# Patient Record
Sex: Male | Born: 1985 | Race: White | Hispanic: No | Marital: Married | Smoking: Never smoker
Health system: Southern US, Community
[De-identification: ages and names within clinical notes are randomized; demographics above are authoritative.]

## PROBLEM LIST (undated history)

## (undated) DIAGNOSIS — E669 Obesity, unspecified: Secondary | ICD-10-CM

## (undated) DIAGNOSIS — R079 Chest pain, unspecified: Secondary | ICD-10-CM

## (undated) DIAGNOSIS — F419 Anxiety disorder, unspecified: Secondary | ICD-10-CM

## (undated) DIAGNOSIS — M722 Plantar fascial fibromatosis: Secondary | ICD-10-CM

## (undated) HISTORY — DX: Anxiety disorder, unspecified: F41.9

## (undated) HISTORY — DX: Obesity, unspecified: E66.9

## (undated) HISTORY — DX: Chest pain, unspecified: R07.9

## (undated) HISTORY — DX: Plantar fascial fibromatosis: M72.2

---
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
## 2007-11-17 ENCOUNTER — Observation Stay (HOSPITAL_COMMUNITY): Admission: EM | Admit: 2007-11-17 | Discharge: 2007-11-19 | Payer: Self-pay | Admitting: Internal Medicine

## 2009-04-08 HISTORY — PX: WISDOM TOOTH EXTRACTION: SHX21
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
## 2010-11-08 ENCOUNTER — Encounter: Payer: Self-pay | Admitting: Podiatry

## 2011-01-04 LAB — HEPATIC FUNCTION PANEL
ALT: 22
AST: 24
Albumin: 3.6
Alkaline Phosphatase: 39
Bilirubin, Direct: <0.1
Total Bilirubin: 0.5
Total Protein: 6.2

## 2011-01-04 LAB — URINALYSIS, ROUTINE W REFLEX MICROSCOPIC
Bilirubin Urine: NEGATIVE
Glucose, UA: NEGATIVE
Hgb urine dipstick: NEGATIVE
Ketones, ur: NEGATIVE
Nitrite: NEGATIVE
Protein, ur: NEGATIVE
Specific Gravity, Urine: 1.034 — ABNORMAL HIGH
Urobilinogen, UA: 0.2
pH: 6

## 2011-01-04 LAB — CBC
HCT: 45.6
Hemoglobin: 15.8
MCHC: 34.6
MCV: 89.7
Platelets: 282
RBC: 5.08
RDW: 12.1
WBC: 4.6

## 2011-01-04 LAB — BASIC METABOLIC PANEL
BUN: 15
CO2: 22
Calcium: 8.8
Chloride: 107
Creatinine, Ser: 0.97
GFR calc Af Amer: >60
GFR calc non Af Amer: >60
Glucose, Bld: 140 — ABNORMAL HIGH
Potassium: 4
Sodium: 138

## 2017-02-06 DIAGNOSIS — F321 Major depressive disorder, single episode, moderate: Secondary | ICD-10-CM

## 2017-02-06 HISTORY — DX: Major depressive disorder, single episode, moderate: F32.1

## 2017-12-17 DIAGNOSIS — Z6833 Body mass index (BMI) 33.0-33.9, adult: Secondary | ICD-10-CM | POA: Diagnosis not present

## 2017-12-17 DIAGNOSIS — F418 Other specified anxiety disorders: Secondary | ICD-10-CM | POA: Diagnosis not present

## 2018-01-01 DIAGNOSIS — Z23 Encounter for immunization: Secondary | ICD-10-CM | POA: Diagnosis not present

## 2018-01-20 DIAGNOSIS — Z31441 Encounter for testing of male partner of patient with recurrent pregnancy loss: Secondary | ICD-10-CM | POA: Diagnosis not present

## 2018-03-10 DIAGNOSIS — J111 Influenza due to unidentified influenza virus with other respiratory manifestations: Secondary | ICD-10-CM | POA: Diagnosis not present

## 2018-06-08 DIAGNOSIS — Z1339 Encounter for screening examination for other mental health and behavioral disorders: Secondary | ICD-10-CM | POA: Diagnosis not present

## 2018-06-08 DIAGNOSIS — F321 Major depressive disorder, single episode, moderate: Secondary | ICD-10-CM | POA: Diagnosis not present

## 2018-06-08 DIAGNOSIS — F418 Other specified anxiety disorders: Secondary | ICD-10-CM | POA: Diagnosis not present

## 2018-06-08 DIAGNOSIS — E668 Other obesity: Secondary | ICD-10-CM | POA: Diagnosis not present

## 2018-06-08 DIAGNOSIS — Z1331 Encounter for screening for depression: Secondary | ICD-10-CM | POA: Diagnosis not present

## 2018-10-08 DIAGNOSIS — D225 Melanocytic nevi of trunk: Secondary | ICD-10-CM | POA: Diagnosis not present

## 2018-10-08 DIAGNOSIS — D1801 Hemangioma of skin and subcutaneous tissue: Secondary | ICD-10-CM | POA: Diagnosis not present

## 2018-10-08 DIAGNOSIS — D485 Neoplasm of uncertain behavior of skin: Secondary | ICD-10-CM | POA: Diagnosis not present

## 2018-10-08 DIAGNOSIS — L570 Actinic keratosis: Secondary | ICD-10-CM | POA: Diagnosis not present

## 2018-10-08 DIAGNOSIS — L812 Freckles: Secondary | ICD-10-CM | POA: Diagnosis not present

## 2018-12-25 DIAGNOSIS — Z20828 Contact with and (suspected) exposure to other viral communicable diseases: Secondary | ICD-10-CM | POA: Diagnosis not present

## 2019-06-21 DIAGNOSIS — E669 Obesity, unspecified: Secondary | ICD-10-CM | POA: Diagnosis not present

## 2019-06-21 DIAGNOSIS — Z1331 Encounter for screening for depression: Secondary | ICD-10-CM | POA: Diagnosis not present

## 2019-06-21 DIAGNOSIS — F419 Anxiety disorder, unspecified: Secondary | ICD-10-CM | POA: Diagnosis not present

## 2019-06-21 DIAGNOSIS — F321 Major depressive disorder, single episode, moderate: Secondary | ICD-10-CM | POA: Diagnosis not present

## 2019-08-15 ENCOUNTER — Encounter (HOSPITAL_BASED_OUTPATIENT_CLINIC_OR_DEPARTMENT_OTHER): Payer: Self-pay

## 2019-08-15 ENCOUNTER — Other Ambulatory Visit: Payer: Self-pay

## 2019-08-15 ENCOUNTER — Emergency Department (HOSPITAL_BASED_OUTPATIENT_CLINIC_OR_DEPARTMENT_OTHER)
Admission: EM | Admit: 2019-08-15 | Discharge: 2019-08-15 | Disposition: A | Discharge status: Home / Self Care | Payer: BC Managed Care – PPO | Attending: Emergency Medicine | Admitting: Emergency Medicine

## 2019-08-15 DIAGNOSIS — W57XXXA Bitten or stung by nonvenomous insect and other nonvenomous arthropods, initial encounter: Secondary | ICD-10-CM | POA: Insufficient documentation

## 2019-08-15 DIAGNOSIS — Y999 Unspecified external cause status: Secondary | ICD-10-CM | POA: Diagnosis not present

## 2019-08-15 DIAGNOSIS — S20461A Insect bite (nonvenomous) of right back wall of thorax, initial encounter: Secondary | ICD-10-CM | POA: Insufficient documentation

## 2019-08-15 DIAGNOSIS — Y92007 Garden or yard of unspecified non-institutional (private) residence as the place of occurrence of the external cause: Secondary | ICD-10-CM | POA: Diagnosis not present

## 2019-08-15 DIAGNOSIS — Y9389 Activity, other specified: Secondary | ICD-10-CM | POA: Insufficient documentation

## 2019-08-15 DIAGNOSIS — S1095XA Superficial foreign body of unspecified part of neck, initial encounter: Secondary | ICD-10-CM | POA: Diagnosis not present

## 2019-08-15 DIAGNOSIS — S1096XA Insect bite of unspecified part of neck, initial encounter: Secondary | ICD-10-CM | POA: Diagnosis not present

## 2019-08-15 MED ORDER — DOXYCYCLINE HYCLATE 100 MG PO TABS
200.0000 mg | ORAL_TABLET | Freq: Once | ORAL | Status: AC
  Administered 2019-08-15: 200 mg via ORAL
  Filled 2019-08-15: qty 2

## 2019-08-15 NOTE — ED Triage Notes (Signed)
Pt noticed a tick on his upper R back today. Small area of surrounding erythema present. Denies any ill feelings.

## 2019-08-15 NOTE — Discharge Instructions (Signed)
Follow up with primary care provider if you notice increased redness or warmth.

## 2019-08-15 NOTE — ED Provider Notes (Signed)
[place] EMERGENCY DEPARTMENT Provider Note   CSN: [id number] Arrival date & time: 08/15/19  1555    History Chief Complaint  Patient presents with  . Tick Removal    Bryan Swanson is a 34 y.o. male with no significant past medical history who presents for evaluation of tick to back.  Did yard work proximately 48 hours ago.  Wife noticed today patient with approximately a millimeter brown tick to right scapula.  Has not tried to remove this.  There is approximately 1.2 cm area of surrounding erythema.  No fluctuance or induration.  Patient denies fever, chills, nausea, vomiting, target lesions, bulla, sensation of throat closing, rashes, lesions.  In addition aggravating relieving factors.  Denies any current pain.  Patient obtained from patient and past medical record.  No interpreter is used.  HPI     History reviewed. No pertinent past medical history.  There are no problems to display for this patient.   History reviewed. No pertinent surgical history.     No family history on file.  Social History   Tobacco Use  . Smoking status: Never Smoker  . Smokeless tobacco: Never Used  Substance Use Topics  . Alcohol use: Yes  . Drug use: Never    Home Medications Prior to Admission medications   Not on File    Allergies    Penicillins  Review of Systems   Review of Systems  Constitutional: Negative.   HENT: Negative.   Respiratory: Negative.   Cardiovascular: Negative.   Gastrointestinal: Negative.   Genitourinary: Negative.   Musculoskeletal: Negative.   Skin: Positive for wound.  Neurological: Negative.   All other systems reviewed and are negative.   Physical Exam Updated Vital Signs BP 136/89   Pulse 67   Temp 98.6 F (37 C) (Oral)   Resp 20   Ht 5\' 9"  (1.753 m)   Wt 99.8 kg   SpO2 95%   BMI 32.49 kg/m   Physical Exam Vitals and nursing note reviewed.  Constitutional:      General: He is not in acute distress.  Appearance: He is well-developed. He is not ill-appearing, toxic-appearing or diaphoretic.  HENT:     Head: Normocephalic and atraumatic.     Nose: Nose normal.     Mouth/Throat:     Mouth: Mucous membranes are moist.  Eyes:     Pupils: Pupils are equal, round, and reactive to light.  Cardiovascular:     Rate and Rhythm: Normal rate and regular rhythm.     Pulses: Normal pulses.     Heart sounds: Normal heart sounds.  Pulmonary:     Effort: Pulmonary effort is normal. No respiratory distress.     Breath sounds: Normal breath sounds.  Abdominal:     General: Bowel sounds are normal. There is no distension.     Palpations: Abdomen is soft.  Musculoskeletal:        General: No swelling, tenderness, deformity or signs of injury. Normal range of motion.     Cervical back: Normal range of motion and neck supple.     Right lower leg: No edema.     Left lower leg: No edema.  Skin:    General: Skin is warm and dry.     Capillary Refill: Capillary refill takes less than 2 seconds.     Comments: 3 millimeter brown tick to right scapula.  Quarter size erythema surrounding this.  No active bleeding or drainage.  No fluctuance or induration.  No target lesions, bulla.  Neurological:     General: No focal deficit present.     Mental Status: He is alert and oriented to person, place, and time.     ED Results / Procedures / Treatments   Labs (all labs ordered are listed, but only abnormal results are displayed) Labs Reviewed - No data to display  EKG None  Radiology No results found.  Procedures .Foreign Body Removal  Date/Time: 08/15/2019 4:23 PM Performed by: Linwood Dibbles, PA-C Authorized by: Linwood Dibbles, PA-C  Consent: Verbal consent obtained. Written consent not obtained. Risks and benefits: risks, benefits and alternatives were discussed Consent given by: patient Patient understanding: patient states understanding of the procedure being performed Patient consent:  the patient's understanding of the procedure matches consent given Procedure consent: procedure consent matches procedure scheduled Relevant documents: relevant documents present and verified Test results: test results available and properly labeled Site marked: the operative site was marked Imaging studies: imaging studies available Required items: required blood products, implants, devices, and special equipment available Body area: skin General location: trunk Location details: back  Anesthesia: Anesthetic total: 0 mL  Sedation: Patient sedated: no  Patient restrained: no Patient cooperative: yes Localization method: visualized Removal mechanism: forceps Dressing: dressing applied Tendon involvement: none Depth: subcutaneous Complexity: simple 1 objects recovered. Objects recovered: tick in entirety with head included Post-procedure assessment: foreign body removed Patient tolerance: patient tolerated the procedure well with no immediate complications   (including critical care time)  Medications Ordered in ED Medications  doxycycline (VIBRA-TABS) tablet 200 mg (200 mg Oral Given 08/15/19 1624)    ED Course  I have reviewed the triage vital signs and the nursing notes.  Pertinent labs & imaging results that were available during my care of the patient were reviewed by me and considered in my medical decision making (see chart for details).  34 year old male presents for evaluation of tach.  Did yard work outside proximally 48 hours ago.  Afebrile, nonseptic, non-ill-appearing.  No target lesion however does have dime sized erythema surrounding tick.  Small tick approximately 3 mm, brown in nature.  Subsequently removed in entirety including head utilized by myself and nursing staff. no fluctuance or induration or evidence of abscess.  No systemic symptoms.  Will treat with single dose of doxycycline.  Discussed return precautions.  He may follow-up outpatient with  PCP.  The patient has been appropriately medically screened and/or stabilized in the ED. I have low suspicion for any other emergent medical condition which would require further screening, evaluation or treatment in the ED or require inpatient management.  Patient is hemodynamically stable and in no acute distress.  Patient able to ambulate in department prior to ED.  Evaluation does not show acute pathology that would require ongoing or additional emergent interventions while in the emergency department or further inpatient treatment.  I have discussed the diagnosis with the patient and answered all questions.  Pain is been managed while in the emergency department and patient has no further complaints prior to discharge.  Patient is comfortable with plan discussed in room and is stable for discharge at this time.  I have discussed strict return precautions for returning to the emergency department.  Patient was encouraged to follow-up with PCP/specialist refer to at discharge.    MDM Rules/Calculators/A&P                       Final Clinical Impression(s) / ED Diagnoses Final diagnoses:  Tick bite with subsequent removal of tick    Rx / DC Orders ED Discharge Orders    None       Quetzali Heinle A, PA-C 08/15/19 1624    Arby Barrette, MD 08/15/19 1642

## 2019-09-29 DIAGNOSIS — J329 Chronic sinusitis, unspecified: Secondary | ICD-10-CM | POA: Diagnosis not present

## 2019-11-28 DIAGNOSIS — Z20822 Contact with and (suspected) exposure to covid-19: Secondary | ICD-10-CM | POA: Diagnosis not present

## 2020-01-24 DIAGNOSIS — R079 Chest pain, unspecified: Secondary | ICD-10-CM | POA: Diagnosis not present

## 2020-01-24 DIAGNOSIS — E669 Obesity, unspecified: Secondary | ICD-10-CM | POA: Diagnosis not present

## 2020-02-02 ENCOUNTER — Encounter: Payer: Self-pay | Admitting: Cardiovascular Disease

## 2020-02-02 DIAGNOSIS — Z23 Encounter for immunization: Secondary | ICD-10-CM | POA: Diagnosis not present

## 2020-02-08 NOTE — Progress Notes (Signed)
Cardiology Office Note:   Date:  02/09/2020  NAME:  Bryan Swanson    MRN: [id number] DOB:  1985-06-12   PCP:  Haywood Pao, MD  Cardiologist:  No primary care provider on file.   Referring MD: Geoffery Lyons, NP   Chief Complaint  Patient presents with  . Chest Pain   History of Present Illness:   Bryan Swanson is a 34 y.o. male with a hx of anxiety who is being seen today for the evaluation of chest pain at the request of Geoffery Lyons, NP.  He reports for the last 6 to 8 months has had spells of dizziness chest pain and palpitations.  He reports are getting worse over the last few months.  He had coronavirus 2 months ago.  He reports that at times when he exerts himself he can feel his heart racing.  He can get dizzy.  He is never passed out but feels like he is going to.  He also gets dull pain in both sides of his chest.  He reports symptoms last 10 to 15 minutes.  They resolve without intervention.  The main thing that bring these on his exertion.  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  He is taken more of an administrative role and is not doing as much.  He also has a second job as a Development worker, international aid.  Apparently he is not done much of this as of lately.  His blood pressure was 131/88.  Medical history is only significant for depression.  He presents with his wife.  She is pregnant with their third child.  He has 2 twin daughters.  He reports he is not stress any more than recently.  He does not smoke.  He consumes alcohol in moderation.  No illicit drug use is reported.  He reports he does not exercise routinely and has not done so since his symptoms started.  No recent thyroid panel.  Labs from his primary care physician demonstrate normal creatinine of 1.0.  He had a hemoglobin of 15.  He does snore.  He is chronically tired.  We do need updated thyroid panel.  His wife reports he snores but does not stop breathing.  He is okay to go home sleep study.  He reports a strong family  history of heart disease.  His grandfather had a heart attack at 74.  Father had a heart attack at 66.  Overall he is concerned he may be developing heart disease.  His EKG today is normal.  Past Medical History: Past Medical History:  Diagnosis Date  . Anxiety disorder   . Chest pain   . Major depressive disorder, single episode, moderate ([place]) 02/2017  . Obesity   . Plantar fascial fibromatosis    left foot    Past Surgical History: Past Surgical History:  Procedure Laterality Date  . WISDOM TOOTH EXTRACTION  2011    Current Medications: Current Meds  Medication Sig  . escitalopram (LEXAPRO) 10 MG tablet Take 1 tablet by mouth daily.     Allergies:    Penicillins   Social History: Social History   Socioeconomic History  . Marital status: Married    Spouse name: Not on file  . Number of children: 2  . Years of education: Not on file  . Highest education level: Not on file  Occupational History  . Occupation: Research officer, trade union  Tobacco Use  . Smoking status: Never Smoker  . Smokeless tobacco: Never  Used  Vaping Use  . Vaping Use: Never used  Substance and Sexual Activity  . Alcohol use: Yes  . Drug use: Never  . Sexual activity: Not on file  Other Topics Concern  . Not on file  Social History Narrative  . Not on file   Social Determinants of Health   Financial Resource Strain:   . Difficulty of Paying Living Expenses: Not on file  Food Insecurity:   . Worried About Charity fundraiser in the Last Year: Not on file  . Ran Out of Food in the Last Year: Not on file  Transportation Needs:   . Lack of Transportation (Medical): Not on file  . Lack of Transportation (Non-Medical): Not on file  Physical Activity:   . Days of Exercise per Week: Not on file  . Minutes of Exercise per Session: Not on file  Stress:   . Feeling of Stress : Not on file  Social Connections:   . Frequency of Communication with Friends and Family: Not on file  . Frequency of Social  Gatherings with Friends and Family: Not on file  . Attends Religious Services: Not on file  . Active Member of Clubs or Organizations: Not on file  . Attends Archivist Meetings: Not on file  . Marital Status: Not on file     Family History: The patient's family history includes Cancer in his mother; Heart attack (age of onset: 67) in his maternal grandfather; Heart attack (age of onset: 50) in his father.  ROS:   All other ROS reviewed and negative. Pertinent positives noted in the HPI.     EKGs/Labs/Other Studies Reviewed:   The following studies were personally reviewed by me today:  EKG:  EKG is ordered today.  The ekg ordered today demonstrates normal sinus rhythm, heart rate 62, no acute ischemic changes, no evidence of prior infarction, and was personally reviewed by me.   Recent Labs: No results found for requested labs within last 8760 hours.   Recent Lipid Panel No results found for: CHOL, TRIG, HDL, CHOLHDL, VLDL, LDLCALC, LDLDIRECT  Physical Exam:   VS:  BP 131/88   Pulse 62   Ht 5' 9"  (1.753 m)   Wt 231 lb 6.4 oz (105 kg)   SpO2 99%   BMI 34.17 kg/m    Wt Readings from Last 3 Encounters:  02/09/20 231 lb 6.4 oz (105 kg)  08/15/19 220 lb (99.8 kg)    General: Well nourished, well developed, in no acute distress Heart: Atraumatic, normal size  Eyes: PEERLA, EOMI  Neck: Supple, no JVD Endocrine: No thryomegaly Cardiac: Normal S1, S2; RRR; no murmurs, rubs, or gallops Lungs: Clear to auscultation bilaterally, no wheezing, rhonchi or rales  Abd: Soft, nontender, no hepatomegaly  Ext: No edema, pulses 2+ Musculoskeletal: No deformities, BUE and BLE strength normal and equal Skin: Warm and dry, no rashes   Neuro: Alert and oriented to person, place, time, and situation, CNII-XII grossly intact, no focal deficits  Psych: Normal mood and affect   ASSESSMENT:   Bryan Swanson is a 34 y.o. male who presents for the following: 1. Chest pain,  unspecified type   2. Palpitations   3. Chronic fatigue   4. Snoring   5. Precordial pain     PLAN:   1. Chest pain, unspecified type -Intermittent episodes of exertional chest pain.  Described as dull pain.  Can last minutes.  Resolves with rest.  Also associated with dizziness and  palpitations.  He is awfully young for heart disease.  However he has a strong family history.  I do not have the results of his recent cholesterol panel.  Main CVD risk factors include obesity and strong family history.  Given that his symptoms may represent angina I recommended a coronary CTA for further evaluation.  He will give [place] a BMP today.  We will set him up for a coronary CTA.  He will take 50 mg of metoprolol tartrate to our before the scan.  He also will obtain an echocardiogram.  2. Palpitations -Symptoms also occur with palpitations.  They are exertional.  Could be an arrhythmia.  We will proceed with a 3-day Zio patch.  Needs a TSH.  3. Chronic fatigue 4. Snoring -He does snore and is fatigued chronically.  We will check a TSH.  He is obese with a BMI of 34.  I recommended a home sleep study.  Disposition: Return in about 3 months (around 05/11/2020).  Medication Adjustments/Labs and Tests Ordered: Current medicines are reviewed at length with the patient today.  Concerns regarding medicines are outlined above.  Orders Placed This Encounter  Procedures  . CT CORONARY MORPH W/CTA COR W/SCORE W/CA W/CM &/OR WO/CM  . CT CORONARY FRACTIONAL FLOW RESERVE DATA PREP  . CT CORONARY FRACTIONAL FLOW RESERVE FLUID ANALYSIS  . Basic metabolic panel  . TSH  . LONG TERM MONITOR (3-14 DAYS)  . ECHOCARDIOGRAM COMPLETE  . Home sleep test   Meds ordered this encounter  Medications  . metoprolol tartrate (LOPRESSOR) 50 MG tablet    Sig: Take 50 mg 2 hours before Coronary CT    Dispense:  1 tablet    Refill:  0    Patient Instructions  Medication Instructions:  Continue same medications *If you need a  refill on your cardiac medications before your next appointment, please call your pharmacy*   Lab Work: Bmet,tsh If you have labs (blood work) drawn today and your tests are completely normal, you will receive your results only by: Marland Kitchen MyChart Message (if you have MyChart) OR . A paper copy in the mail If you have any lab test that is abnormal or we need to change your treatment, we will call you to review the results.   Testing/Procedures: Echo   Home Sleep Study   3 day Zio Monitor   Coronary CT  Will be scheduled after approved by insurance   Follow instructions below   Follow-Up: At [facility], you and your health needs are our priority.  As part of our continuing mission to provide you with exceptional heart care, we have created designated Provider Care Teams.  These Care Teams include your primary Cardiologist (physician) and Advanced Practice Providers (APPs -  Physician Assistants and Nurse Practitioners) who all work together to provide you with the care you need, when you need it.  We recommend signing up for the patient portal called "MyChart".  Sign up information is provided on this After Visit Summary.  MyChart is used to connect with patients for Virtual Visits (Telemedicine).  Patients are able to view lab/test results, encounter notes, upcoming appointments, etc.  Non-urgent messages can be sent to your provider as well.   To learn more about what you can do with MyChart, go to [email or web address].    Your next appointment: 3 months    The format for your next appointment: Office    Provider:  Dr.O'Neal    Your cardiac CT will be scheduled at  one of the below locations:   [facility] [address] [place], [place] [address] [phone]  [facility] [address] [place], [place] [address] [phone]  If scheduled at [facility], please arrive at the [facility]  main entrance of [facility] 30 minutes prior to test start time. Proceed to the [facility] Radiology Department (first floor) to check-in and test prep.  If scheduled at [facility], please arrive 15 mins early for check-in and test prep.  Please follow these instructions carefully (unless otherwise directed):  Hold all erectile dysfunction medications at least 3 days (72 hrs) prior to test.  On the Night Before the Test: . Be sure to Drink plenty of water. . Do not consume any caffeinated/decaffeinated beverages or chocolate 12 hours prior to your test. . Do not take any antihistamines 12 hours prior to your test.   On the Day of the Test: . Drink plenty of water. Do not drink any water within one hour of the test. . Do not eat any food 4 hours prior to the test. . You may take your regular medications prior to the test.  . Take metoprolol 50 mg two hours prior to test.          After the Test: . Drink plenty of water. . After receiving IV contrast, you may experience a mild flushed feeling. This is normal. . On occasion, you may experience a mild rash up to 24 hours after the test. This is not dangerous. If this occurs, you can take Benadryl 25 mg and increase your fluid intake. . If you experience trouble breathing, this can be serious. If it is severe call 911 IMMEDIATELY. If it is mild, please call our office.    Once we have confirmed authorization from your insurance company, we will call you to set up a date and time for your test. Based on how quickly your insurance processes prior authorizations requests, please allow up to 4 weeks to be contacted for scheduling your Cardiac CT appointment. Be advised that routine Cardiac CT appointments could be scheduled as many as 8 weeks after your provider has ordered it.  For non-scheduling related questions, please contact the cardiac imaging nurse navigator should you have any  questions/concerns: Marchia Bond, Cardiac Imaging Nurse Navigator Burley Saver, Interim Cardiac Imaging Nurse [place] [facility] Services Direct Office Dial: [phone]   For scheduling needs, including cancellations and rescheduling, please call Vivien Rota at [phone], option 3.        Signed, Addison Naegeli. Audie Box, [place]  [address], [place], [place] [address] [phone]  02/09/2020 4:38 PM

## 2020-02-09 ENCOUNTER — Ambulatory Visit: Payer: BC Managed Care – PPO | Admitting: Cardiovascular Disease

## 2020-02-09 ENCOUNTER — Telehealth: Payer: Self-pay | Admitting: Radiology

## 2020-02-09 ENCOUNTER — Other Ambulatory Visit: Payer: Self-pay

## 2020-02-09 ENCOUNTER — Encounter: Payer: Self-pay | Admitting: Cardiovascular Disease

## 2020-02-09 VITALS — BP 131/88 | HR 62 | Ht 69.0 in | Wt 231.4 lb

## 2020-02-09 DIAGNOSIS — R0683 Snoring: Secondary | ICD-10-CM | POA: Diagnosis not present

## 2020-02-09 DIAGNOSIS — R002 Palpitations: Secondary | ICD-10-CM | POA: Diagnosis not present

## 2020-02-09 DIAGNOSIS — R5382 Chronic fatigue, unspecified: Secondary | ICD-10-CM

## 2020-02-09 DIAGNOSIS — R072 Precordial pain: Secondary | ICD-10-CM

## 2020-02-09 DIAGNOSIS — R079 Chest pain, unspecified: Secondary | ICD-10-CM

## 2020-02-09 MED ORDER — METOPROLOL TARTRATE 50 MG PO TABS
ORAL_TABLET | ORAL | 0 refills | Status: DC
Start: 2020-02-09 — End: 2020-06-12

## 2020-02-09 NOTE — Patient Instructions (Signed)
Medication Instructions:  Continue same medications *If you need a refill on your cardiac medications before your next appointment, please call your pharmacy*   Lab Work: Bmet,tsh If you have labs (blood work) drawn today and your tests are completely normal, you will receive your results only by: . MyChart Message (if you have MyChart) OR . A paper copy in the mail If you have any lab test that is abnormal or we need to change your treatment, we will call you to review the results.   Testing/Procedures: Echo   Home Sleep Study   3 day Zio Monitor   Coronary CT  Will be scheduled after approved by insurance   Follow instructions below   Follow-Up: At [facility], you and your health needs are our priority.  As part of our continuing mission to provide you with exceptional heart care, we have created designated Provider Care Teams.  These Care Teams include your primary Cardiologist (physician) and Advanced Practice Providers (APPs -  Physician Assistants and Nurse Practitioners) who all work together to provide you with the care you need, when you need it.  We recommend signing up for the patient portal called "MyChart".  Sign up information is provided on this After Visit Summary.  MyChart is used to connect with patients for Virtual Visits (Telemedicine).  Patients are able to view lab/test results, encounter notes, upcoming appointments, etc.  Non-urgent messages can be sent to your provider as well.   To learn more about what you can do with MyChart, go to [email or web address].    Your next appointment: 3 months    The format for your next appointment: Office    Provider:  Dr.O'Neal    Your cardiac CT will be scheduled at one of the below locations:   [facility] [address] [place], [place] [address] [phone]  OR  [facility] [address] [place], [place] [address] [phone]  If  scheduled at [facility], please arrive at the North Tower main entrance of [facility] 30 minutes prior to test start time. Proceed to the [facility] Radiology Department (first floor) to check-in and test prep.  If scheduled at [facility], please arrive 15 mins early for check-in and test prep.  Please follow these instructions carefully (unless otherwise directed):  Hold all erectile dysfunction medications at least 3 days (72 hrs) prior to test.  On the Night Before the Test: . Be sure to Drink plenty of water. . Do not consume any caffeinated/decaffeinated beverages or chocolate 12 hours prior to your test. . Do not take any antihistamines 12 hours prior to your test.   On the Day of the Test: . Drink plenty of water. Do not drink any water within one hour of the test. . Do not eat any food 4 hours prior to the test. . You may take your regular medications prior to the test.  . Take metoprolol 50 mg two hours prior to test.          After the Test: . Drink plenty of water. . After receiving IV contrast, you may experience a mild flushed feeling. This is normal. . On occasion, you may experience a mild rash up to 24 hours after the test. This is not dangerous. If this occurs, you can take Benadryl 25 mg and increase your fluid intake. . If you experience trouble breathing, this can be serious. If it is severe call   911 IMMEDIATELY. If it is mild, please call our office.    Once we have confirmed authorization from your insurance company, we will call you to set up a date and time for your test. Based on how quickly your insurance processes prior authorizations requests, please allow up to 4 weeks to be contacted for scheduling your Cardiac CT appointment. Be advised that routine Cardiac CT appointments could be scheduled as many as 8 weeks after your provider has ordered it.  For non-scheduling related questions, please contact the  cardiac imaging nurse navigator should you have any questions/concerns: Marchia Bond, Cardiac Imaging Nurse Navigator Burley Saver, Interim Cardiac Imaging Nurse [place] and V[facility] Services Direct Office Dial: [phone]   For scheduling needs, including cancellations and rescheduling, please call Vivien Rota at [phone], option 3.

## 2020-02-09 NOTE — Telephone Encounter (Signed)
Enrolled patient for a 3 day Zio XT monitor to be mailed to patients home  

## 2020-02-10 LAB — BASIC METABOLIC PANEL
BUN/Creatinine Ratio: 20 (ref 9–20)
BUN: 18 mg/dL (ref 6–20)
CO2: 25 mmol/L (ref 20–29)
Calcium: 10.2 mg/dL (ref 8.7–10.2)
Chloride: 99 mmol/L (ref 96–106)
Creatinine, Ser: 0.91 mg/dL (ref 0.76–1.27)
GFR calc Af Amer: 125 mL/min/{1.73_m2} (ref >59)
GFR calc non Af Amer: 108 mL/min/{1.73_m2} (ref >59)
Glucose: 84 mg/dL (ref 65–99)
Potassium: 4.5 mmol/L (ref 3.5–5.2)
Sodium: 140 mmol/L (ref 134–144)

## 2020-02-10 LAB — TSH: TSH: 2.6 u[IU]/mL (ref 0.450–4.500)

## 2020-02-11 NOTE — Addendum Note (Signed)
Addended by: Brunetta Genera on: 02/11/2020 04:59 PM   Modules accepted: Orders

## 2020-02-14 ENCOUNTER — Ambulatory Visit (INDEPENDENT_AMBULATORY_CARE_PROVIDER_SITE_OTHER): Payer: BC Managed Care – PPO

## 2020-02-14 DIAGNOSIS — R002 Palpitations: Secondary | ICD-10-CM

## 2020-02-14 DIAGNOSIS — R5382 Chronic fatigue, unspecified: Secondary | ICD-10-CM

## 2020-02-14 DIAGNOSIS — R079 Chest pain, unspecified: Secondary | ICD-10-CM

## 2020-02-14 DIAGNOSIS — R0683 Snoring: Secondary | ICD-10-CM

## 2020-02-16 ENCOUNTER — Telehealth: Payer: Self-pay | Admitting: Cardiovascular Disease

## 2020-02-16 NOTE — Telephone Encounter (Signed)
No PA required for HST - waiting on sleep center to call back to schedule

## 2020-02-22 ENCOUNTER — Telehealth (HOSPITAL_COMMUNITY): Payer: Self-pay | Admitting: Emergency Medicine

## 2020-02-22 NOTE — Telephone Encounter (Signed)
Reaching out to patient to offer assistance regarding upcoming cardiac imaging study; pt verbalizes understanding of appt date/time, parking situation and where to check in, pre-test NPO status and medications ordered, and verified current allergies; name and call back number provided for further questions should they arise Rockwell Alexandria RN Navigator Cardiac Imaging [facility] [phone] office [phone] cell   Pt verbalized understanding to take metoprolol 2 hr prior to scan, avoiding antihistamines and ED medications. [place] Dec

## 2020-02-23 ENCOUNTER — Ambulatory Visit (HOSPITAL_COMMUNITY)
Admission: RE | Admit: 2020-02-23 | Discharge: 2020-02-23 | Disposition: A | Discharge status: Home / Self Care | Payer: BC Managed Care – PPO | Source: Ambulatory Visit | Attending: Cardiovascular Disease | Admitting: Cardiovascular Disease

## 2020-02-23 ENCOUNTER — Other Ambulatory Visit: Payer: Self-pay

## 2020-02-23 ENCOUNTER — Encounter: Payer: Self-pay | Admitting: *Deleted

## 2020-02-23 DIAGNOSIS — Z006 Encounter for examination for normal comparison and control in clinical research program: Secondary | ICD-10-CM

## 2020-02-23 DIAGNOSIS — R072 Precordial pain: Secondary | ICD-10-CM | POA: Diagnosis not present

## 2020-02-23 MED ORDER — NITROGLYCERIN 0.4 MG SL SUBL
0.8000 mg | SUBLINGUAL_TABLET | Freq: Once | SUBLINGUAL | Status: AC
  Administered 2020-02-23: 0.8 mg via SUBLINGUAL

## 2020-02-23 MED ORDER — NITROGLYCERIN 0.4 MG SL SUBL
SUBLINGUAL_TABLET | SUBLINGUAL | Status: AC
  Filled 2020-02-23: qty 2

## 2020-02-23 MED ORDER — IOHEXOL 350 MG/ML SOLN
80.0000 mL | Freq: Once | INTRAVENOUS | Status: AC | PRN
  Administered 2020-02-23: 80 mL via INTRAVENOUS

## 2020-02-23 NOTE — Research (Signed)
IDENTIFY Informed Consent                  Subject Name:   Bryan Swanson. Bryan Swanson   Subject met inclusion and exclusion criteria.  The informed consent form, study requirements and expectations were reviewed with the subject and questions and concerns were addressed prior to the signing of the consent form.  The subject verbalized understanding of the trial requirements.  The subject agreed to participate in the IDENTIFY trial and signed the informed consent.  The informed consent was obtained prior to performance of any protocol-specific procedures for the subject.  A copy of the signed informed consent was given to the subject and a copy was placed in the subject's medical record.   [place] Junaid Wurzer, Research Assistant  02/23/2020 13:09

## 2020-02-24 ENCOUNTER — Other Ambulatory Visit: Payer: Self-pay | Admitting: *Deleted

## 2020-02-24 MED ORDER — OMEPRAZOLE 20 MG PO CPDR: 20.0000 mg | DELAYED_RELEASE_CAPSULE | Freq: Every day | ORAL | 2 refills | Status: AC

## 2020-02-25 DIAGNOSIS — R002 Palpitations: Secondary | ICD-10-CM | POA: Diagnosis not present

## 2020-02-25 NOTE — Telephone Encounter (Signed)
Patient is aware and agreeable to Home Sleep Study through [facility]. Patient is scheduled for 03/14/20 at 1:30 to pick up home sleep kit and meet with Respiratory therapist at [facility]. Patient is aware that if this appointment date and time does not work for them they should contact Artis Delay directly at [phone]. Patient is aware that a sleep packet will be sent from [facility] in week. Patient is agreeable to treatment and thankful for call.

## 2020-03-01 ENCOUNTER — Ambulatory Visit (HOSPITAL_COMMUNITY): Payer: BC Managed Care – PPO | Attending: Cardiology

## 2020-03-01 ENCOUNTER — Other Ambulatory Visit: Payer: Self-pay

## 2020-03-01 DIAGNOSIS — R079 Chest pain, unspecified: Secondary | ICD-10-CM | POA: Diagnosis not present

## 2020-03-01 DIAGNOSIS — R0683 Snoring: Secondary | ICD-10-CM | POA: Insufficient documentation

## 2020-03-01 DIAGNOSIS — R002 Palpitations: Secondary | ICD-10-CM | POA: Diagnosis not present

## 2020-03-01 DIAGNOSIS — R5382 Chronic fatigue, unspecified: Secondary | ICD-10-CM | POA: Insufficient documentation

## 2020-03-02 LAB — ECHOCARDIOGRAM COMPLETE
Area-P 1/2: 5.09 cm2
S' Lateral: 2.7 cm

## 2020-03-14 ENCOUNTER — Ambulatory Visit (HOSPITAL_BASED_OUTPATIENT_CLINIC_OR_DEPARTMENT_OTHER): Payer: BC Managed Care – PPO | Attending: Cardiovascular Disease | Admitting: Cardiovascular Disease

## 2020-03-14 ENCOUNTER — Other Ambulatory Visit: Payer: Self-pay

## 2020-03-14 DIAGNOSIS — R002 Palpitations: Secondary | ICD-10-CM | POA: Insufficient documentation

## 2020-03-14 DIAGNOSIS — R5382 Chronic fatigue, unspecified: Secondary | ICD-10-CM | POA: Insufficient documentation

## 2020-03-14 DIAGNOSIS — R0683 Snoring: Secondary | ICD-10-CM | POA: Diagnosis not present

## 2020-03-14 DIAGNOSIS — R079 Chest pain, unspecified: Secondary | ICD-10-CM

## 2020-03-14 DIAGNOSIS — G4733 Obstructive sleep apnea (adult) (pediatric): Secondary | ICD-10-CM | POA: Diagnosis not present

## 2020-03-16 ENCOUNTER — Encounter: Payer: Self-pay | Admitting: *Deleted

## 2020-03-27 ENCOUNTER — Encounter (HOSPITAL_BASED_OUTPATIENT_CLINIC_OR_DEPARTMENT_OTHER): Payer: Self-pay | Admitting: Cardiovascular Disease

## 2020-03-27 ENCOUNTER — Telehealth: Payer: Self-pay | Admitting: *Deleted

## 2020-03-27 DIAGNOSIS — G4733 Obstructive sleep apnea (adult) (pediatric): Secondary | ICD-10-CM

## 2020-03-27 NOTE — Procedures (Signed)
    Patient Name: Bryan Swanson, Bryan Swanson Date: 03/14/2020 Gender: Male D.O.B: 04-26-1985 Age (years): 34 Referring Provider: Ronnald Ramp ONeal Height (inches): 69 Interpreting Physician: Nicki Guadalajara MD, ABSM Weight (lbs): 217 RPSGT: [place] BMI: 32 MRN: [id number] Neck Size: 17.00  CLINICAL INFORMATION Sleep Study Type: HST  Indication for sleep study: N/A  Epworth Sleepiness Score: 7  SLEEP STUDY TECHNIQUE A multi-channel overnight portable sleep study was performed. The channels recorded were: nasal airflow, thoracic respiratory movement, and oxygen saturation with a pulse oximetry. Snoring was also monitored.  MEDICATIONS escitalopram (LEXAPRO) 10 MG tablet metoprolol tartrate (LOPRESSOR) 50 MG tablet omeprazole (PRILOSEC) 20 MG capsule Patient self administered medications include: N/A.  SLEEP ARCHITECTURE Patient was studied for 378 minutes. The sleep efficiency was 99.9 % and the patient was supine for 0%. The arousal index was 0.0 per hour.  RESPIRATORY PARAMETERS The overall AHI was 21.0 per hour, with a central apnea index of 0.0 per hour. Supine sleep was not present throughout the study.  The oxygen nadir was 83% during sleep.  CARDIAC DATA Mean heart rate during sleep was 70.3 bpm.  IMPRESSIONS - Moderate obstructive sleep apnea occurred during this study (AHI 21.0/h). - No significant central sleep apnea occurred during this study (CAI 0.0/h). - Moderate oxygen desaturation to a nadir of 83%. Time spent < 89% was 4.6 minutes. - Patient snored 32.5% during the sleep.  DIAGNOSIS - Obstructive Sleep Apnea (G47.33)  RECOMMENDATIONS - Recommend a CPAP titration study to further evauate and treat the patient's sleep disordered breathing. If unable to have an in-lab titration initiate Auto-PAP 6- 18 cm of water. - Avoid alcohol, sedatives and other CNS depressants that may worsen sleep apnea and disrupt normal sleep architecture. - Sleep  hygiene should be reviewed to assess factors that may improve sleep quality. - Weight management and regular exercise should be initiated or continued. - Recommend a download after 30 days of CPAP and sleep clinic evaluation.  [Electronically signed] 03/27/2020 10:32 AM  Nicki Guadalajara MD, [facility], ABSM Diplomate, American Board of Sleep Medicine   NPI: [id number] [facility] PH: [phone]   FX: [phone] ACCREDITED BY THE AMERICAN ACADEMY OF SLEEP MEDICINE

## 2020-03-27 NOTE — Telephone Encounter (Signed)
-----   Message from Lennette Bihari, MD sent at 03/27/2020 10:36 AM EST ----- [place], please notify pt of the results and try for in-lab titration study; if unable then Auto-PAP

## 2020-03-27 NOTE — Telephone Encounter (Signed)
Informed patient of sleep study results and patient understanding was verbalized. Patient understands her sleep study showed   IMPRESSIONS - Moderate obstructive sleep apnea occurred during this study (AHI 21.0/h). - Moderate oxygen desaturation to a nadir of 83%. Time spent < 89% was 4.6 minutes. - Patient snored 32.5% during the sleep.  DIAGNOSIS - Obstructive Sleep Apnea (G47.33)  RECOMMENDATIONS - Recommend a CPAP titration study to further evauate and treat the patient's sleep disordered breathing. If unable to have an in-lab titration initiate Auto-PAP 6- 18 cm of water.  Titration sent to sleep pool

## 2020-04-04 NOTE — Addendum Note (Signed)
Addended by: Reesa Chew on: 04/04/2020 04:27 PM   Modules accepted: Orders

## 2020-04-06 ENCOUNTER — Telehealth: Payer: Self-pay | Admitting: Cardiovascular Disease

## 2020-04-06 NOTE — Telephone Encounter (Signed)
No PA required.  Waiting on sleep lab to call back to schedule.

## 2020-04-11 NOTE — Telephone Encounter (Signed)
Called patient and told him is scheduled for Monday, Feb. 7 at 8 pm.  He says may have to reschedule because they are expecting a new baby

## 2020-04-17 ENCOUNTER — Ambulatory Visit: Payer: BC Managed Care – PPO

## 2020-05-11 ENCOUNTER — Ambulatory Visit: Payer: BC Managed Care – PPO | Admitting: Cardiovascular Disease

## 2020-05-15 ENCOUNTER — Encounter (HOSPITAL_BASED_OUTPATIENT_CLINIC_OR_DEPARTMENT_OTHER): Payer: BC Managed Care – PPO | Admitting: Cardiovascular Disease

## 2020-06-11 NOTE — Progress Notes (Unsigned)
Cardiology Office Note:   Date:  06/12/2020  NAME:  Bryan Swanson    MRN: [id number] DOB:  December 16, 1985   PCP:  Gaspar Garbe, MD  Cardiologist:  No primary care provider on file.   Referring MD: Gaspar Garbe, MD   Chief Complaint  Patient presents with  . Follow-up   History of Present Illness:   Bryan Swanson is a 35 y.o. male with a hx of OSA who presents for follow-up. Seen 02/2020 for CP/SOB/palpitations after covid. Normal CCTA. Normal echo. Normal monitor. Found to have OSA.  Reports he is relieved to know that his heart testing was normal.  We did start him on Prilosec and this is improved his symptoms of chest pain.  He overall feels much better on this medication.  In fact his symptoms came back when he ran out of Prilosec.  He is continue to take this.  He was also found to have moderate sleep apnea.  This was also contributing to his symptoms.  He recently had a baby and has not had time to do his in lab sleep titration study.  He will plan to do this in the next few weeks to months.  Overall his heart health appears to be quite healthy.  He has no need to see me.  I recommended he complete a repeat coronary calcium score every 5 years.  Problem List 1. Family history of heart disease -GF @ 45 years  -Father @ 55 years 2. OSA -moderate   Past Medical History: Past Medical History:  Diagnosis Date  . Anxiety disorder   . Chest pain   . Major depressive disorder, single episode, moderate (HCC) 02/2017  . Obesity   . Plantar fascial fibromatosis    left foot    Past Surgical History: Past Surgical History:  Procedure Laterality Date  . WISDOM TOOTH EXTRACTION  2011    Current Medications: No outpatient medications have been marked as taking for the 06/12/20 encounter (Office Visit) with Sande Rives, MD.     Allergies:    Penicillins   Social History: Social History   Socioeconomic History  . Marital status: Married    Spouse name:  Not on file  . Number of children: 2  . Years of education: Not on file  . Highest education level: Not on file  Occupational History  . Occupation: Warden/ranger  Tobacco Use  . Smoking status: Never Smoker  . Smokeless tobacco: Never Used  Vaping Use  . Vaping Use: Never used  Substance and Sexual Activity  . Alcohol use: Yes  . Drug use: Never  . Sexual activity: Not on file  Other Topics Concern  . Not on file  Social History Narrative  . Not on file   Social Determinants of Health   Financial Resource Strain: Not on file  Food Insecurity: Not on file  Transportation Needs: Not on file  Physical Activity: Not on file  Stress: Not on file  Social Connections: Not on file     Family History: The patient's family history includes Cancer in his mother; Heart attack (age of onset: 56) in his maternal grandfather; Heart attack (age of onset: 27) in his father.  ROS:   All other ROS reviewed and negative. Pertinent positives noted in the HPI.     EKGs/Labs/Other Studies Reviewed:   The following studies were personally reviewed by me today:   TTE 03/01/2020 1. Left ventricular ejection fraction, by estimation, is 60  to 65%. The  left ventricle has normal function. The left ventricle has no regional  wall motion abnormalities. Left ventricular diastolic parameters were  normal.  2. Right ventricular systolic function is normal. The right ventricular  size is normal. Tricuspid regurgitation signal is inadequate for assessing  PA pressure.  3. The mitral valve is normal in structure. No evidence of mitral valve  regurgitation.  4. The aortic valve was not well visualized. Aortic valve regurgitation  is not visualized. Mild aortic valve sclerosis is present, with no  evidence of aortic valve stenosis.  5. The inferior vena cava is normal in size with greater than 50%  respiratory variability, suggesting right atrial pressure of 3 mmHg.   Zio 03/05/2020 1. No  arrhythmia detected to explain symptoms.  2. Rare ectopy.   CCTA 02/23/2020 IMPRESSION: 1. Coronary calcium score of 0. 2. Normal coronary origin with right dominance. 3. Normal coronary arteries. RECOMMENDATIONS: 1. No evidence of CAD (0%). Consider non-atherosclerotic causes of chest pain.  Recent Labs: 02/09/2020: BUN 18; Creatinine, Ser 0.91; Potassium 4.5; Sodium 140; TSH 2.600   Recent Lipid Panel No results found for: CHOL, TRIG, HDL, CHOLHDL, VLDL, LDLCALC, LDLDIRECT  Physical Exam:   VS:  BP 120/80   Pulse 67   Ht 5\' 9"  (1.753 m)   Wt 234 lb (106.1 kg)   SpO2 97%   BMI 34.56 kg/m    Wt Readings from Last 3 Encounters:  06/12/20 234 lb (106.1 kg)  03/14/20 217 lb (98.4 kg)  02/09/20 231 lb 6.4 oz (105 kg)    General: Well nourished, well developed, in no acute distress Head: Atraumatic, normal size  Eyes: PEERLA, EOMI  Neck: Supple, no JVD Endocrine: No thryomegaly Cardiac: Normal S1, S2; RRR; no murmurs, rubs, or gallops Lungs: Clear to auscultation bilaterally, no wheezing, rhonchi or rales  Abd: Soft, nontender, no hepatomegaly  Ext: No edema, pulses 2+ Musculoskeletal: No deformities, BUE and BLE strength normal and equal Skin: Warm and dry, no rashes   Neuro: Alert and oriented to person, place, time, and situation, CNII-XII grossly intact, no focal deficits  Psych: Normal mood and affect   ASSESSMENT:   Bryan Swanson is a 35 y.o. male who presents for the following: 1. OSA (obstructive sleep apnea)   2. Chest pain, unspecified type   3. Palpitations     PLAN:   1. OSA (obstructive sleep apnea) -Moderate sleep apnea.  Awaiting in lab sleep study.  He will follow-up with Dr. 31 after this.  2. Chest pain, unspecified type -Atypical chest pain.  Strong family history.  Coronary CTA was negative.  Echo was normal.  Monitor normal.  His coronary calcium score is 0.  I recommended no statin at this time.  He should repeat a coronary calcium score  in 5 years. -His symptoms of chest pain improved with Prilosec.  He will continue this.  Symptoms were acid reflux related.  3. Palpitations -Resolved.  Disposition: Return if symptoms worsen or fail to improve.  Medication Adjustments/Labs and Tests Ordered: Current medicines are reviewed at length with the patient today.  Concerns regarding medicines are outlined above.  No orders of the defined types were placed in this encounter.  No orders of the defined types were placed in this encounter.   Patient Instructions  Medication Instructions:  The current medical regimen is effective;  continue present plan and medications.  *If you need a refill on your cardiac medications before your next appointment, please call  your pharmacy*   Follow-Up: At [facility], you and your health needs are our priority.  As part of our continuing mission to provide you with exceptional heart care, we have created designated Provider Care Teams.  These Care Teams include your primary Cardiologist (physician) and Advanced Practice Providers (APPs -  Physician Assistants and Nurse Practitioners) who all work together to provide you with the care you need, when you need it.  We recommend signing up for the patient portal called "MyChart".  Sign up information is provided on this After Visit Summary.  MyChart is used to connect with patients for Virtual Visits (Telemedicine).  Patients are able to view lab/test results, encounter notes, upcoming appointments, etc.  Non-urgent messages can be sent to your provider as well.   To learn more about what you can do with MyChart, go to [email or web address].    Your next appointment:   As needed  The format for your next appointment:   In Person  Provider:   Lennie Odor, MD       Time Spent with Patient: I have spent a total of 25 minutes with patient reviewing hospital notes, telemetry, EKGs, labs and examining the patient as well as establishing  an assessment and plan that was discussed with the patient.  > 50% of time was spent in direct patient care.  Signed, Lenna Gilford. Flora Lipps, MD, [facility]  [facility]  [address] [place], [place] [address] [phone]  06/12/2020 2:43 PM

## 2020-06-12 ENCOUNTER — Other Ambulatory Visit: Payer: Self-pay

## 2020-06-12 ENCOUNTER — Encounter: Payer: Self-pay | Admitting: Cardiovascular Disease

## 2020-06-12 ENCOUNTER — Ambulatory Visit: Payer: BC Managed Care – PPO | Admitting: Cardiovascular Disease

## 2020-06-12 VITALS — BP 120/80 | HR 67 | Ht 69.0 in | Wt 234.0 lb

## 2020-06-12 DIAGNOSIS — R002 Palpitations: Secondary | ICD-10-CM

## 2020-06-12 DIAGNOSIS — G4733 Obstructive sleep apnea (adult) (pediatric): Secondary | ICD-10-CM

## 2020-06-12 DIAGNOSIS — R079 Chest pain, unspecified: Secondary | ICD-10-CM | POA: Diagnosis not present

## 2020-06-12 NOTE — Patient Instructions (Signed)
Medication Instructions:  The current medical regimen is effective;  continue present plan and medications.  *If you need a refill on your cardiac medications before your next appointment, please call your pharmacy*    Follow-Up: At [facility], you and your health needs are our priority.  As part of our continuing mission to provide you with exceptional heart care, we have created designated Provider Care Teams.  These Care Teams include your primary Cardiologist (physician) and Advanced Practice Providers (APPs -  Physician Assistants and Nurse Practitioners) who all work together to provide you with the care you need, when you need it.  We recommend signing up for the patient portal called "MyChart".  Sign up information is provided on this After Visit Summary.  MyChart is used to connect with patients for Virtual Visits (Telemedicine).  Patients are able to view lab/test results, encounter notes, upcoming appointments, etc.  Non-urgent messages can be sent to your provider as well.   To learn more about what you can do with MyChart, go to [email or web address].    Your next appointment:   As needed  The format for your next appointment:   In Person  Provider:   [place] O'Neal, MD      

## 2020-07-04 ENCOUNTER — Telehealth: Payer: Self-pay | Admitting: *Deleted

## 2020-07-04 DIAGNOSIS — Z006 Encounter for examination for normal comparison and control in clinical research program: Secondary | ICD-10-CM

## 2020-07-04 NOTE — Telephone Encounter (Signed)
I called patient to follow-up on 90-day Identify Study phone call. Patient is doing well and I reminded patient I would call him back in November for 1-year study phone call.

## 2020-07-19 DIAGNOSIS — J321 Chronic frontal sinusitis: Secondary | ICD-10-CM | POA: Diagnosis not present

## 2021-02-09 DIAGNOSIS — E781 Pure hyperglyceridemia: Secondary | ICD-10-CM | POA: Diagnosis not present

## 2021-02-13 DIAGNOSIS — Z1331 Encounter for screening for depression: Secondary | ICD-10-CM | POA: Diagnosis not present

## 2021-02-13 DIAGNOSIS — Z0000 Encounter for general adult medical examination without abnormal findings: Secondary | ICD-10-CM | POA: Diagnosis not present

## 2021-02-13 DIAGNOSIS — F321 Major depressive disorder, single episode, moderate: Secondary | ICD-10-CM | POA: Diagnosis not present

## 2021-02-13 DIAGNOSIS — R82998 Other abnormal findings in urine: Secondary | ICD-10-CM | POA: Diagnosis not present

## 2021-02-13 DIAGNOSIS — Z1339 Encounter for screening examination for other mental health and behavioral disorders: Secondary | ICD-10-CM | POA: Diagnosis not present

## 2021-09-27 IMAGING — CT CT HEART MORP W/ CTA COR W/ SCORE W/ CA W/CM &/OR W/O CM
4 of 7 series · 8 of 20 positions shown, 9 images · IV contrast (APPLIED)
Comparison: None.
COMPARISON: None.

Addendum:
EXAM:
OVER-READ INTERPRETATION  CT CHEST

The following report is an over-read performed by radiologist Dr.
over-read does not include interpretation of cardiac or coronary
anatomy or pathology. The coronary CTA interpretation by the
cardiologist is attached.
CLINICAL DATA: Chest pain
Cardiac/Coronary CTA
TECHNIQUE: The patient was scanned on a Phillips Force scanner. A 100 kV
prospective scan was triggered in the descending thoracic aorta at
111 HU's. Axial non-contrast 3 mm slices were carried out through
the heart. The data set was analyzed on a dedicated work station and
scored using the Agatson method. Gantry rotation speed was 250 msecs
and collimation was .6 mm. No beta blockade and 0.8 mg of sl NTG was
given. The 3D data set was reconstructed in 5% intervals of the
35-75 % of the R-R cycle. Diastolic phases were analyzed on a
dedicated work station using MPR, MIP and VRT modes. The patient
received 80 cc of contrast.

[Series 6: best diast · axial · 0.39mm/px · z∈[-352,-311]mm · 2 of 306 slices shown, 3 images]
[im 102/306  vessel]
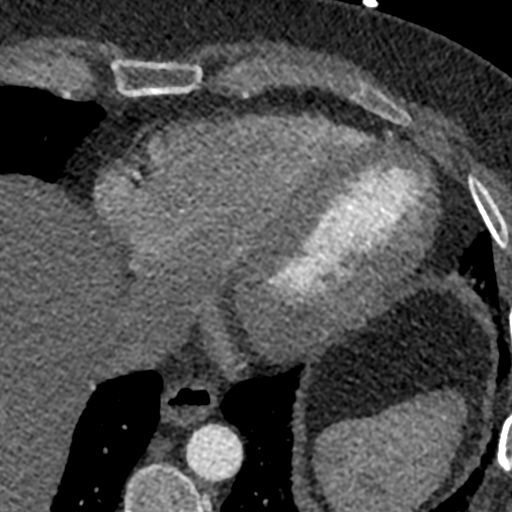
[im 102/306  lung]
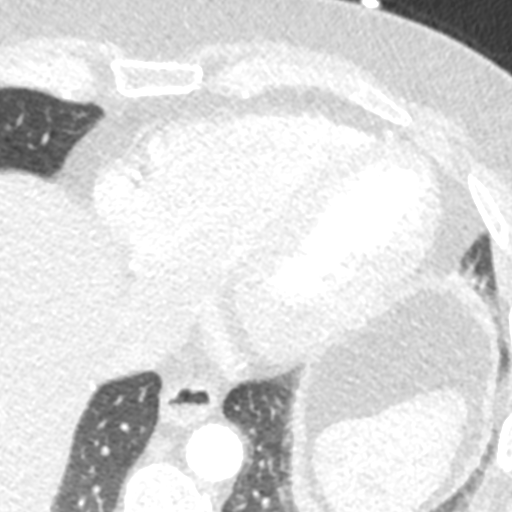
[im 204/306  vessel]
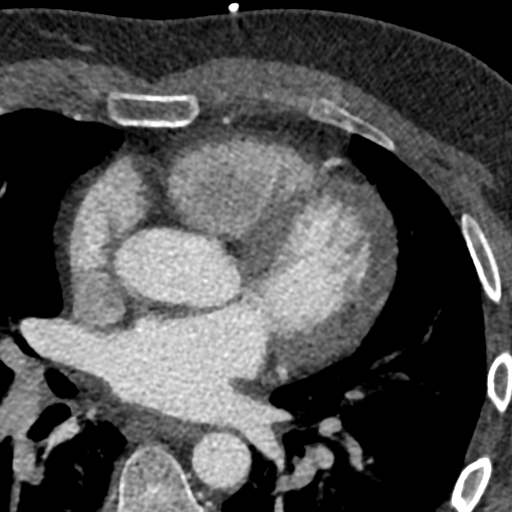

[Series 7: best syst · axial · 0.39mm/px · z∈[-352,-311]mm · 2 of 306 slices shown]
[im 102/306  vessel]
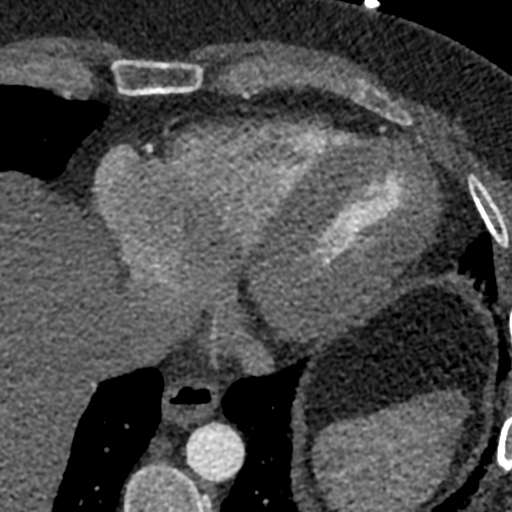
[im 204/306  vessel]
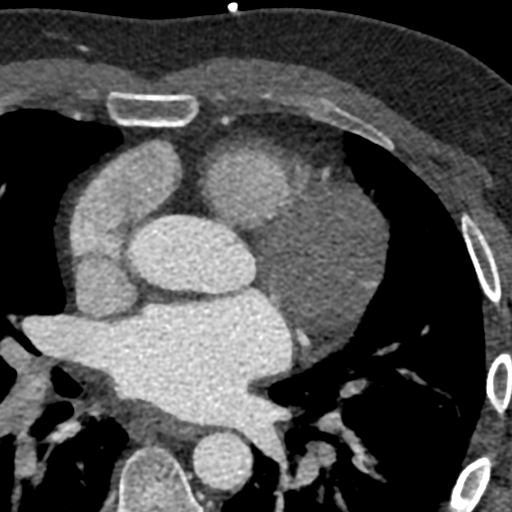

[Series 8: ts diast sharp · axial · 0.39mm/px · z∈[-352,-311]mm · 2 of 306 slices shown]
[im 102/306  lung]
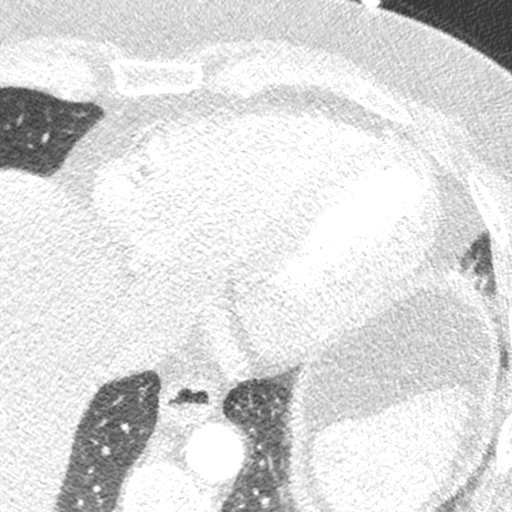
[im 204/306  lung]
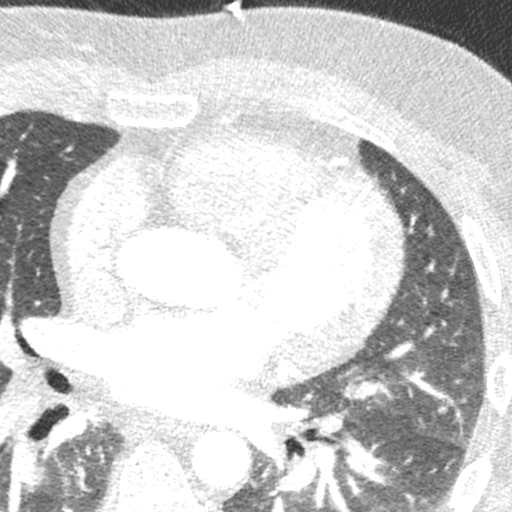

[Series 9: ts syst sharp · axial · 0.39mm/px · z∈[-352,-311]mm · 2 of 306 slices shown]
[im 102/306  lung]
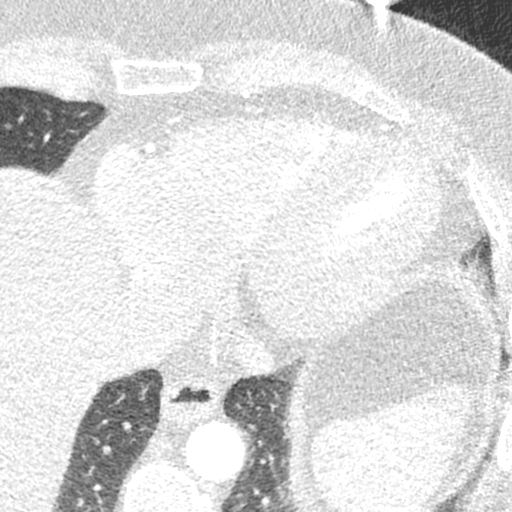
[im 204/306  lung]
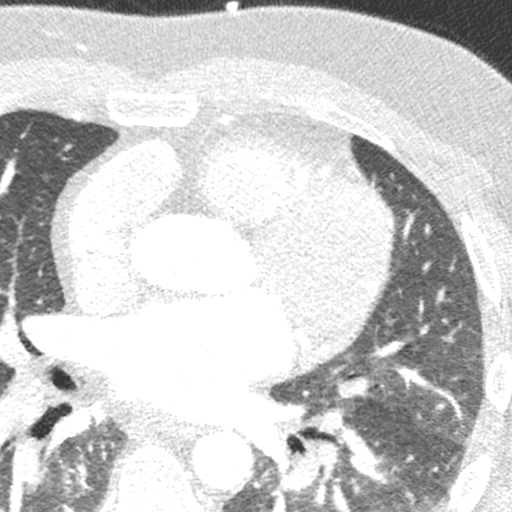

[8 of 20 positions shown; findings below may reference images not displayed]

FINDINGS: Vascular: Normal aortic caliber. No central pulmonary embolism, on
this non-dedicated study.

Mediastinum/Nodes: No imaged thoracic adenopathy. Esophageal fluid
level on 39/11.

Lungs/Pleura: No pleural fluid.  Clear imaged lungs.

Upper Abdomen: Normal imaged portions of the liver, spleen, stomach.

Musculoskeletal: No acute osseous abnormality.
IMPRESSION: 1. No acute findings in the imaged extracardiac chest.
2. Esophageal air fluid level suggests dysmotility or
gastroesophageal reflux.
FINDINGS: Image quality: Excellent.

Noise artifact is: Limited.

Coronary Arteries:  Normal coronary origin.  Right dominance.

Left main: The left main is a large caliber vessel with a normal
take off from the left coronary cusp that trifurcates into a LAD,
LCX, and ramus intermedius. There is no plaque or stenosis.

Left anterior descending artery: The LAD is patent without evidence
of plaque or stenosis. The LAD gives off 2 patent diagonal branches.

Ramus intermedius: Patent with no evidence of plaque or stenosis.

Left circumflex artery: The LCX is non-dominant and patent with no
evidence of plaque or stenosis. The LCX gives off 1 patent obtuse
marginal branches.

Right coronary artery: The RCA is dominant with normal take off from
the right coronary cusp. There is no evidence of plaque or stenosis.
The RCA terminates as a PDA and right posterolateral branch without
evidence of plaque or stenosis.

Right Atrium: Right atrial size is within normal limits.

Right Ventricle: The right ventricular cavity is within normal
limits.

Left Atrium: Left atrial size is normal in size with no left atrial
appendage filling defect.

Left Ventricle: The ventricular cavity size is within normal limits.
There are no stigmata of prior infarction. There is no abnormal
filling defect.

Pulmonary arteries: Normal in size without proximal filling defect.

Pulmonary veins: Normal pulmonary venous drainage.

Pericardium: Normal thickness with no significant effusion or
calcium present.

Cardiac valves: The aortic valve is trileaflet without significant
calcification. The mitral valve is normal structure without
significant calcification.

Aorta: Normal caliber with no significant disease.

Extra-cardiac findings: See attached radiology report for
non-cardiac structures.
IMPRESSION: 1. Coronary calcium score of 0.

2. Normal coronary origin with right dominance.

3. Normal coronary arteries.

RECOMMENDATIONS:
1. No evidence of CAD (0%). Consider non-atherosclerotic causes of
chest pain.

*** End of Addendum ***
EXAM:
OVER-READ INTERPRETATION  CT CHEST

The following report is an over-read performed by radiologist Dr.
over-read does not include interpretation of cardiac or coronary
anatomy or pathology. The coronary CTA interpretation by the
cardiologist is attached.
FINDINGS: Vascular: Normal aortic caliber. No central pulmonary embolism, on
this non-dedicated study.

Mediastinum/Nodes: No imaged thoracic adenopathy. Esophageal fluid
level on 39/11.

Lungs/Pleura: No pleural fluid.  Clear imaged lungs.

Upper Abdomen: Normal imaged portions of the liver, spleen, stomach.

Musculoskeletal: No acute osseous abnormality.
IMPRESSION: 1. No acute findings in the imaged extracardiac chest.
2. Esophageal air fluid level suggests dysmotility or
gastroesophageal reflux.

## 2021-12-03 DIAGNOSIS — R5383 Other fatigue: Secondary | ICD-10-CM | POA: Diagnosis not present

## 2021-12-03 DIAGNOSIS — F321 Major depressive disorder, single episode, moderate: Secondary | ICD-10-CM | POA: Diagnosis not present

## 2021-12-03 DIAGNOSIS — E291 Testicular hypofunction: Secondary | ICD-10-CM | POA: Diagnosis not present

## 2022-02-04 ENCOUNTER — Telehealth: Payer: Self-pay

## 2022-02-04 NOTE — Telephone Encounter (Signed)
Received referral for sleep for patient. Pt had HST in 2022 with Dr. Shelva Majestic - pt will need to follow up with this provider for sleep care. Unable to see pt in our office. Referral declined, referring office notified.

## 2022-05-14 DIAGNOSIS — Z125 Encounter for screening for malignant neoplasm of prostate: Secondary | ICD-10-CM | POA: Diagnosis not present

## 2022-05-14 DIAGNOSIS — Z79899 Other long term (current) drug therapy: Secondary | ICD-10-CM | POA: Diagnosis not present

## 2022-05-14 DIAGNOSIS — R5383 Other fatigue: Secondary | ICD-10-CM | POA: Diagnosis not present

## 2022-05-14 DIAGNOSIS — E781 Pure hyperglyceridemia: Secondary | ICD-10-CM | POA: Diagnosis not present

## 2022-05-20 DIAGNOSIS — Z0000 Encounter for general adult medical examination without abnormal findings: Secondary | ICD-10-CM | POA: Diagnosis not present

## 2022-05-20 DIAGNOSIS — Z1339 Encounter for screening examination for other mental health and behavioral disorders: Secondary | ICD-10-CM | POA: Diagnosis not present

## 2022-05-20 DIAGNOSIS — Z1331 Encounter for screening for depression: Secondary | ICD-10-CM | POA: Diagnosis not present

## 2022-05-20 DIAGNOSIS — R82998 Other abnormal findings in urine: Secondary | ICD-10-CM | POA: Diagnosis not present

## 2022-05-20 DIAGNOSIS — F321 Major depressive disorder, single episode, moderate: Secondary | ICD-10-CM | POA: Diagnosis not present

## 2022-08-23 LAB — LAB REPORT - SCANNED
A1c: 5.4
EGFR: 96

## 2022-10-08 DIAGNOSIS — R918 Other nonspecific abnormal finding of lung field: Secondary | ICD-10-CM | POA: Diagnosis not present

## 2022-11-12 ENCOUNTER — Ambulatory Visit (INDEPENDENT_AMBULATORY_CARE_PROVIDER_SITE_OTHER): Payer: BC Managed Care – PPO

## 2022-11-12 ENCOUNTER — Ambulatory Visit: Payer: BC Managed Care – PPO | Attending: Physician Assistant | Admitting: Physician Assistant

## 2022-11-12 ENCOUNTER — Encounter: Payer: Self-pay | Admitting: Physician Assistant

## 2022-11-12 ENCOUNTER — Telehealth: Payer: Self-pay | Admitting: Cardiovascular Disease

## 2022-11-12 VITALS — BP 148/110 | HR 60 | Ht 69.0 in | Wt 234.0 lb

## 2022-11-12 DIAGNOSIS — K449 Diaphragmatic hernia without obstruction or gangrene: Secondary | ICD-10-CM | POA: Diagnosis not present

## 2022-11-12 DIAGNOSIS — E782 Mixed hyperlipidemia: Secondary | ICD-10-CM | POA: Diagnosis not present

## 2022-11-12 DIAGNOSIS — I10 Essential (primary) hypertension: Secondary | ICD-10-CM | POA: Diagnosis not present

## 2022-11-12 DIAGNOSIS — R072 Precordial pain: Secondary | ICD-10-CM | POA: Diagnosis not present

## 2022-11-12 DIAGNOSIS — R079 Chest pain, unspecified: Secondary | ICD-10-CM | POA: Diagnosis not present

## 2022-11-12 LAB — COMPREHENSIVE METABOLIC PANEL
ALT: 26 IU/L (ref 0–44)
AST: 21 IU/L (ref 0–40)
Albumin: 4.6 g/dL (ref 4.1–5.1)
Alkaline Phosphatase: 76 IU/L (ref 44–121)
BUN/Creatinine Ratio: 17 (ref 9–20)
BUN: 17 mg/dL (ref 6–20)
Bilirubin Total: 0.4 mg/dL (ref 0.0–1.2)
CO2: 24 mmol/L (ref 20–29)
Calcium: 9.7 mg/dL (ref 8.7–10.2)
Chloride: 102 mmol/L (ref 96–106)
Creatinine, Ser: 1.02 mg/dL (ref 0.76–1.27)
Globulin, Total: 2.8 g/dL (ref 1.5–4.5)
Glucose: 92 mg/dL (ref 70–99)
Potassium: 4.4 mmol/L (ref 3.5–5.2)
Sodium: 139 mmol/L (ref 134–144)
Total Protein: 7.4 g/dL (ref 6.0–8.5)
eGFR: 96 mL/min/{1.73_m2} (ref >59)

## 2022-11-12 LAB — CBC
Hematocrit: 41.1 % (ref 37.5–51.0)
Hemoglobin: 14.4 g/dL (ref 13.0–17.7)
MCH: 29.1 pg (ref 26.6–33.0)
MCHC: 35 g/dL (ref 31.5–35.7)
MCV: 83 fL (ref 79–97)
Platelets: 188 10*3/uL (ref 150–450)
RBC: 4.94 x10E6/uL (ref 4.14–5.80)
RDW: 14 % (ref 11.6–15.4)
WBC: 5.8 10*3/uL (ref 3.4–10.8)

## 2022-11-12 LAB — SEDIMENTATION RATE: Sed Rate: 9 mm/hr (ref 0–15)

## 2022-11-12 LAB — TROPONIN T: Troponin T (Highly Sensitive): <6 ng/L (ref 0–22)

## 2022-11-12 MED ORDER — IOHEXOL 350 MG/ML SOLN
100.0000 mL | Freq: Once | INTRAVENOUS | Status: AC | PRN
  Administered 2022-11-12: 75 mL via INTRAVENOUS

## 2022-11-12 MED ORDER — AMLODIPINE BESYLATE 5 MG PO TABS: 5.0000 mg | ORAL_TABLET | Freq: Every day | ORAL | 3 refills | Status: DC

## 2022-11-12 MED ORDER — NITROGLYCERIN 0.4 MG SL SUBL: 0.4000 mg | SUBLINGUAL_TABLET | SUBLINGUAL | 3 refills | Status: AC | PRN

## 2022-11-12 MED ORDER — ROSUVASTATIN CALCIUM 20 MG PO TABS: 20.0000 mg | ORAL_TABLET | Freq: Every day | ORAL | 3 refills | Status: DC

## 2022-11-12 MED ORDER — ASPIRIN 81 MG PO TBEC: 81.0000 mg | DELAYED_RELEASE_TABLET | Freq: Every day | ORAL | Status: AC

## 2022-11-12 NOTE — Assessment & Plan Note (Signed)
Reviewed labs from primary care done in May 2024. Total cholesterol 324, triglycerides 447, LDL 179, HDL 56, A1c 5.2, Hg 15.2, Creatinine 1.02, K 4.4, ALT 39. -Start Rosuvastatin 20mg  daily.

## 2022-11-12 NOTE — Progress Notes (Signed)
Cardiology Office Note:    Date:  11/12/2022  ID:  Bryan Swanson, DOB 1985/05/13, MRN [id number] PCP: Gaspar Garbe, MD  [facility] Providers Cardiologist:  Reatha Harps, MD       Patient Profile:      Hypertension  Hyperlipidemia FHx of CAD OSA GERD Chest pain  TTE 03/01/2020: EF 60-65, no RWMA, normal RVSF, AV sclerosis, RAP 3 Monitor 02/2020: No arrhythmias, rare ectopy CCTA 02/23/2020: CAC score 0, no CAD           Discussed the use of AI scribe software for clinical note transcription with the patient, who gave verbal consent to proceed.  History of Present Illness   A 37 year old male with a history of chest pain, palpitations, and a family history of CAD presents with a two-week history of worsening right-sided chest pain. The pain is described as dull and nagging, with no clear triggers or relieving factors. The patient denies any association with meals. The pain sometimes radiates to the back but does not travel elsewhere. The patient reports that the pain intensity varies, with some episodes being quite severe. The patient also reports that the pain sometimes worsens with deep breaths and when lying down, but these symptoms are not consistent.  The patient had pneumonia a month ago, which was reportedly severe but resolved with treatment. However, since the pneumonia, the patient has noticed shortness of breath, particularly with exertion such as taking out the trash. This shortness of breath is a new symptom for the patient. The patient also reports a decrease in energy levels over the past year and some weight gain.  The patient has a strong family history of heart disease, with his father having a 99% blockage in the LAD in his 66s and his maternal grandfather dying from heart disease at a young age. The patient's cholesterol and blood pressure have been increasing over the past two years, with recent readings being significantly elevated. The patient  denies any other symptoms such as fever, chills, vomiting, diarrhea, bloody stools, bloody urine, or black stools.      ROS: See HPI    Studies Reviewed:   EKG Interpretation Date/Time:  Tuesday November 12 2022 10:11:06 EDT Ventricular Rate:  60 PR Interval:  178 QRS Duration:  84 QT Interval:  390 QTC Calculation: 390 R Axis:   27  Text Interpretation: Normal sinus rhythm Normal Axis TW inversion in III J point elevation Confirmed by Tereso Newcomer [phone]) on 11/12/2022 12:40:27 PM    Risk Assessment/Calculations:     HYPERTENSION CONTROL Vitals:   11/12/22 1009 11/12/22 1240  BP: (!) 160/94 (!) 148/110    The patient's blood pressure is elevated above target today.  In order to address the patient's elevated BP: A new medication was prescribed today.          Physical Exam:   VS:  BP (!) 148/110   Pulse 60   Ht 5\' 9"  (1.753 m)   Wt 234 lb (106.1 kg)   SpO2 97%   BMI 34.56 kg/m    Wt Readings from Last 3 Encounters:  11/12/22 234 lb (106.1 kg)  06/12/20 234 lb (106.1 kg)  03/14/20 217 lb (98.4 kg)    GEN: Well nourished, well developed in no acute distress  NECK: No JVD CARDIAC: RRR, no murmurs, rubs, gallops RESPIRATORY:  Clear to auscultation without rales, wheezing or rhonchi  ABDOMEN: Soft EXTREMITIES:  No edema     Assessment and Plan:  Precordial chest pain New onset, right-sided, dull, nagging chest pain for two weeks. No clear triggers or relievers. Pain sometimes worsens with deep breaths and when lying down. Recent history of pneumonia. Family history of CAD. Prior workup in 2021 showed no CAD or arrhythmias, and symptoms improved with PPI therapy for acid reflux. -Order CT pulmonary angiogram to rule out pulmonary embolism. -Order labs: stat hsTroponin, sed rate, and CRP to assess for myocardial infarction and pericarditis. If hsTroponin is elevated, will send to the ED for possible cardiac cath. If ESR +/- CRP elevated, will initiate NSAIDs, Colchicine  for pericarditis. -Schedule echocardiogram to assess cardiac function and look for pericardial effusion. -If hsTroponin negative, schedule nuclear stress test to assess for ischemia. -Start Aspirin 81mg  daily. -Provide Nitroglycerin for use as needed for chest pain.  Essential hypertension Blood pressure elevated, recently started on Metoprolol. -Continue Metoprolol tartrate 25 mg twice daily  -Add Amlodipine 5mg  daily.  Mixed hyperlipidemia Reviewed labs from primary care done in May 2024. Total cholesterol 324, triglycerides 447, LDL 179, HDL 56, A1c 5.2, Hg 15.2, Creatinine 1.02, K 4.4, ALT 39. -Start Rosuvastatin 20mg  daily.    Informed Consent   Shared Decision Making/Informed Consent The risks [chest pain, shortness of breath, cardiac arrhythmias, dizziness, blood pressure fluctuations, myocardial infarction, stroke/transient ischemic attack, nausea, vomiting, allergic reaction, radiation exposure, metallic taste sensation and life-threatening complications (estimated to be 1 in 10,000)], benefits (risk stratification, diagnosing coronary artery disease, treatment guidance) and alternatives of a nuclear stress test were discussed in detail with Bryan Swanson and he agrees to proceed.     Dispo:  Return in about 2 weeks (around 11/26/2022) for Follow up after testing.  Signed, Tereso Newcomer, PA-C

## 2022-11-12 NOTE — Telephone Encounter (Signed)
Spoke with Bryan Swanson. She states the request was to call the results. Results are posted to chart. Nothing out of range.

## 2022-11-12 NOTE — Patient Instructions (Addendum)
Medication Instructions:  Your physician has recommended you make the following change in your medication:  1) START amlodipine 5 mg daily 2) START taking aspirin 81 mg daily  3) START taking Crestor (rosuvastatin) 20 mg daily   *If you need a refill on your cardiac medications before your next appointment, please call your pharmacy*   Lab Work: TODAY: CMET, CBC, troponin (stat), ESR, CRP  If you have labs (blood work) drawn today and your tests are completely normal, you will receive your results only by: MyChart Message (if you have MyChart) OR A paper copy in the mail If you have any lab test that is abnormal or we need to change your treatment, we will call you to review the results.   Testing/Procedures: Your physician has requested that you have cardiac CT (STAT). Cardiac computed tomography (CT) is a painless test that uses an x-ray machine to take clear, detailed pictures of your heart.   Your physician has requested that you have an echocardiogram. Echocardiography is a painless test that uses sound waves to create images of your heart. It provides your doctor with information about the size and shape of your heart and how well your heart's chambers and valves are working. This procedure takes approximately one hour. There are no restrictions for this procedure. Please do NOT wear cologne, perfume, aftershave, or lotions (deodorant is allowed). Please arrive 15 minutes prior to your appointment time.  Your physician has requested that you have a lexiscan myoview. For further information please visit [email or web address] Please follow instruction sheet, as given.  Follow-Up: At [facility], you and your health needs are our priority.  As part of our continuing mission to provide you with exceptional heart care, we have created designated Provider Care Teams.  These Care Teams include your primary Cardiologist (physician) and Advanced Practice Providers (APPs -  Physician  Assistants and Nurse Practitioners) who all work together to provide you with the care you need, when you need it.  Your next appointment:   2 week(s)  Provider:   Dr. Flora Lipps or APP

## 2022-11-12 NOTE — Telephone Encounter (Signed)
Ayla with [facility] is calling to report STAT results.

## 2022-11-12 NOTE — Telephone Encounter (Signed)
Result note sent to pt via Mychart. Tereso Newcomer, PA-C    11/12/2022 5:14 PM

## 2022-11-12 NOTE — Assessment & Plan Note (Signed)
Blood pressure elevated, recently started on Metoprolol. -Continue Metoprolol tartrate 25 mg twice daily  -Add Amlodipine 5mg  daily.

## 2022-11-12 NOTE — Assessment & Plan Note (Signed)
New onset, right-sided, dull, nagging chest pain for two weeks. No clear triggers or relievers. Pain sometimes worsens with deep breaths and when lying down. Recent history of pneumonia. Family history of CAD. Prior workup in 2021 showed no CAD or arrhythmias, and symptoms improved with PPI therapy for acid reflux. -Order CT pulmonary angiogram to rule out pulmonary embolism. -Order labs: stat hsTroponin, sed rate, and CRP to assess for myocardial infarction and pericarditis. If hsTroponin is elevated, will send to the ED for possible cardiac cath. If ESR +/- CRP elevated, will initiate NSAIDs, Colchicine for pericarditis. -Schedule echocardiogram to assess cardiac function and look for pericardial effusion. -If hsTroponin negative, schedule nuclear stress test to assess for ischemia. -Start Aspirin 81mg  daily. -Provide Nitroglycerin for use as needed for chest pain.

## 2022-11-28 ENCOUNTER — Ambulatory Visit: Payer: BC Managed Care – PPO | Admitting: Physician Assistant

## 2022-12-02 ENCOUNTER — Telehealth (HOSPITAL_COMMUNITY): Payer: Self-pay

## 2022-12-02 NOTE — Telephone Encounter (Signed)
Spoke with the patient, detailed instructions given. He stated that he would be here for his test. Asked to call back with any questions. S.Williams EMTP/CCT 

## 2022-12-03 ENCOUNTER — Ambulatory Visit (HOSPITAL_COMMUNITY): Payer: BC Managed Care – PPO | Attending: Physician Assistant

## 2022-12-03 ENCOUNTER — Ambulatory Visit (HOSPITAL_BASED_OUTPATIENT_CLINIC_OR_DEPARTMENT_OTHER): Payer: BC Managed Care – PPO

## 2022-12-03 DIAGNOSIS — I10 Essential (primary) hypertension: Secondary | ICD-10-CM

## 2022-12-03 DIAGNOSIS — R072 Precordial pain: Secondary | ICD-10-CM | POA: Diagnosis not present

## 2022-12-03 LAB — MYOCARDIAL PERFUSION IMAGING
LV dias vol: 100 mL (ref 62–150)
LV sys vol: 40 mL
Nuc Stress EF: 60 %
Peak HR: 94 {beats}/min
Rest HR: 61 {beats}/min
Rest Nuclear Isotope Dose: 10.2 mCi
SDS: 1
SRS: 0
SSS: 1
ST Depression (mm): 0 mm
Stress Nuclear Isotope Dose: 31 mCi
TID: 1.06

## 2022-12-03 LAB — ECHOCARDIOGRAM COMPLETE
Area-P 1/2: 4.8 cm2
Height: 69 in
S' Lateral: 2.95 cm
Weight: 3744 oz

## 2022-12-03 MED ORDER — TECHNETIUM TC 99M TETROFOSMIN IV KIT
31.0000 | PACK | Freq: Once | INTRAVENOUS | Status: AC | PRN
  Administered 2022-12-03: 31 via INTRAVENOUS

## 2022-12-03 MED ORDER — TECHNETIUM TC 99M TETROFOSMIN IV KIT
10.2000 | PACK | Freq: Once | INTRAVENOUS | Status: AC | PRN
  Administered 2022-12-03: 10.2 via INTRAVENOUS

## 2022-12-03 MED ORDER — REGADENOSON 0.4 MG/5ML IV SOLN
0.4000 mg | Freq: Once | INTRAVENOUS | Status: AC
  Administered 2022-12-03: 0.4 mg via INTRAVENOUS

## 2022-12-09 NOTE — Progress Notes (Unsigned)
  Cardiology Office Note:    Date:  12/10/2022  ID:  Bryan Swanson, DOB 1985/07/04, MRN [id number] PCP: Gaspar Garbe, MD  [facility] Providers Cardiologist:  Reatha Harps, MD       Patient Profile:      Hypertension  Hyperlipidemia FHx of CAD OSA GERD Chest pain  TTE 03/01/2020: EF 60-65, no RWMA, normal RVSF, AV sclerosis, RAP 3 Monitor 02/2020: No arrhythmias, rare ectopy CCTA 02/23/2020: CAC score 0, no CAD TTE 12/03/22: EF 60-65, no RWMA, mild LVH, GLS -17.8, NL RVSF, NL PASP, AV sclerosis, RAP 8 Myoview 12/03/22: no ischemia or infarction, EF 60, low risk          History of Present Illness:  Discussed the use of AI scribe software for clinical note transcription with the patient, who gave verbal consent to proceed.    37 year old male who returns for follow up on chest pain. He is here alone. He notes he is feeling significantly better, with some residual pain through the shoulder and arm, which he attributed to an injury. He also noted that his chest pain subsided a few days after starting amlodipine. He denies any recurrent chest pain, shortness of breath, dizziness, or fainting. He also reported occasional mild swelling in the legs in the afternoons, but, otherwise no swelling.      ROS: See HPI    Studies Reviewed:        Risk Assessment/Calculations:             Physical Exam:   VS:  BP 110/80   Pulse 61   Ht 5\' 9"  (1.753 m)   Wt 237 lb (107.5 kg)   SpO2 96%   BMI 35.00 kg/m    Wt Readings from Last 3 Encounters:  12/10/22 237 lb (107.5 kg)  12/03/22 234 lb (106.1 kg)  11/12/22 234 lb (106.1 kg)    Constitutional:      Appearance: Healthy appearance. Not in distress.  Pulmonary:     Breath sounds: Normal breath sounds. No wheezing. No rales.  Cardiovascular:     Normal rate. Regular rhythm.     Murmurs: There is no murmur.  Edema:    Peripheral edema absent.        Assessment and Plan:     Chest Pain Resolved. History of  right-sided dull, nagging chest pain with pleuritic component. He underwent extensive workup including CT chest, which was neg for pulmonary embolism; CRP, ESR, troponin was normal; echocardiogram with normal EF and nuclear stress test negative for ischemia.  -No further testing needed. -F/u 2 years  Hypertension Well controlled -Continue amlodipine 5mg  daily and metoprolol tartrate 25mg  twice daily.  Hyperlipidemia LDL above goal in May 2024 at 179. Started on rosuvastatin (Crestor) 20mg  daily. -Check CMET, Lipids today. -Continue rosuvastatin 20mg  daily.           Dispo:  Return in about 2 years (around 12/09/2024) for Routine follow up in 2 years with Dr. Flora Lipps.  Signed, Tereso Newcomer, PA-C

## 2022-12-10 ENCOUNTER — Ambulatory Visit: Payer: BC Managed Care – PPO | Attending: Physician Assistant | Admitting: Physician Assistant

## 2022-12-10 ENCOUNTER — Encounter: Payer: Self-pay | Admitting: Physician Assistant

## 2022-12-10 VITALS — BP 110/80 | HR 61 | Ht 69.0 in | Wt 237.0 lb

## 2022-12-10 DIAGNOSIS — R072 Precordial pain: Secondary | ICD-10-CM | POA: Diagnosis not present

## 2022-12-10 DIAGNOSIS — I10 Essential (primary) hypertension: Secondary | ICD-10-CM | POA: Diagnosis not present

## 2022-12-10 DIAGNOSIS — E782 Mixed hyperlipidemia: Secondary | ICD-10-CM | POA: Diagnosis not present

## 2022-12-10 LAB — COMPREHENSIVE METABOLIC PANEL
ALT: 37 IU/L (ref 0–44)
AST: 26 IU/L (ref 0–40)
Albumin: 4.6 g/dL (ref 4.1–5.1)
Alkaline Phosphatase: 84 IU/L (ref 44–121)
BUN/Creatinine Ratio: 16 (ref 9–20)
BUN: 16 mg/dL (ref 6–20)
Bilirubin Total: 0.4 mg/dL (ref 0.0–1.2)
CO2: 25 mmol/L (ref 20–29)
Calcium: 9.7 mg/dL (ref 8.7–10.2)
Chloride: 102 mmol/L (ref 96–106)
Creatinine, Ser: 1.01 mg/dL (ref 0.76–1.27)
Globulin, Total: 3 g/dL (ref 1.5–4.5)
Glucose: 96 mg/dL (ref 70–99)
Potassium: 4.8 mmol/L (ref 3.5–5.2)
Sodium: 139 mmol/L (ref 134–144)
Total Protein: 7.6 g/dL (ref 6.0–8.5)
eGFR: 97 mL/min/{1.73_m2} (ref >59)

## 2022-12-10 LAB — LIPID PANEL
Chol/HDL Ratio: 2.4 ratio (ref 0.0–5.0)
Cholesterol, Total: 165 mg/dL (ref 100–199)
HDL: 68 mg/dL (ref >39)
LDL Chol Calc (NIH): 63 mg/dL (ref 0–99)
Triglycerides: 215 mg/dL — ABNORMAL HIGH (ref 0–149)
VLDL Cholesterol Cal: 34 mg/dL (ref 5–40)

## 2022-12-10 NOTE — Patient Instructions (Signed)
Medication Instructions:   Your physician recommends that you continue on your current medications as directed. Please refer to the Current Medication list given to you today.   *If you need a refill on your cardiac medications before your next appointment, please call your pharmacy*   Lab Work:  TODAY!!!! CMET/LIPID  If you have labs (blood work) drawn today and your tests are completely normal, you will receive your results only by: MyChart Message (if you have MyChart) OR A paper copy in the mail If you have any lab test that is abnormal or we need to change your treatment, we will call you to review the results.   Testing/Procedures:  None ordered.   Follow-Up: At [facility], you and your health needs are our priority.  As part of our continuing mission to provide you with exceptional heart care, we have created designated Provider Care Teams.  These Care Teams include your primary Cardiologist (physician) and Advanced Practice Providers (APPs -  Physician Assistants and Nurse Practitioners) who all work together to provide you with the care you need, when you need it.  We recommend signing up for the patient portal called "MyChart".  Sign up information is provided on this After Visit Summary.  MyChart is used to connect with patients for Virtual Visits (Telemedicine).  Patients are able to view lab/test results, encounter notes, upcoming appointments, etc.  Non-urgent messages can be sent to your provider as well.   To learn more about what you can do with MyChart, go to [email or web address].    Your next appointment:   2 year(s)  Provider:   Reatha Harps, MD     Other Instructions  Your physician wants you to follow-up in: 2 years.  You will receive a reminder letter in the mail two months in advance. If you don't receive a letter, please call our office to schedule the follow-up appointment.

## 2023-03-20 ENCOUNTER — Telehealth: Payer: Self-pay | Admitting: Physician Assistant

## 2023-03-20 NOTE — Telephone Encounter (Signed)
  Office called to make provider aware that they received an invalid code of Z82.9. they stated that they sent a fax regarding this on 11/12/22. Needing to know if code will be updated or removed. Office will refax today. Please advise

## 2023-03-21 NOTE — Telephone Encounter (Signed)
Can you tell me what test you need an ICD10 for? Tereso Newcomer, PA-C    03/21/2023 1:05 PM

## 2023-03-25 NOTE — Telephone Encounter (Signed)
Covering Scott's inbox today. Per note review below, no further action required, will remove from inbox.

## 2023-03-25 NOTE — Telephone Encounter (Signed)
Per call to [facility], this lab has been paid in full by the patient's insurance.  No further action required.

## 2023-05-22 DIAGNOSIS — Z79899 Other long term (current) drug therapy: Secondary | ICD-10-CM | POA: Diagnosis not present

## 2023-05-22 DIAGNOSIS — E781 Pure hyperglyceridemia: Secondary | ICD-10-CM | POA: Diagnosis not present

## 2023-06-05 DIAGNOSIS — Z0000 Encounter for general adult medical examination without abnormal findings: Secondary | ICD-10-CM | POA: Diagnosis not present

## 2023-06-05 DIAGNOSIS — R82998 Other abnormal findings in urine: Secondary | ICD-10-CM | POA: Diagnosis not present

## 2023-06-05 DIAGNOSIS — Z1339 Encounter for screening examination for other mental health and behavioral disorders: Secondary | ICD-10-CM | POA: Diagnosis not present

## 2023-06-05 DIAGNOSIS — Z1331 Encounter for screening for depression: Secondary | ICD-10-CM | POA: Diagnosis not present

## 2023-06-05 DIAGNOSIS — M722 Plantar fascial fibromatosis: Secondary | ICD-10-CM | POA: Diagnosis not present

## 2023-07-03 DIAGNOSIS — L918 Other hypertrophic disorders of the skin: Secondary | ICD-10-CM | POA: Diagnosis not present

## 2023-07-03 DIAGNOSIS — L570 Actinic keratosis: Secondary | ICD-10-CM | POA: Diagnosis not present

## 2023-07-03 DIAGNOSIS — C44319 Basal cell carcinoma of skin of other parts of face: Secondary | ICD-10-CM | POA: Diagnosis not present

## 2023-07-03 DIAGNOSIS — L738 Other specified follicular disorders: Secondary | ICD-10-CM | POA: Diagnosis not present

## 2023-07-03 DIAGNOSIS — D224 Melanocytic nevi of scalp and neck: Secondary | ICD-10-CM | POA: Diagnosis not present

## 2023-07-03 DIAGNOSIS — C44212 Basal cell carcinoma of skin of right ear and external auricular canal: Secondary | ICD-10-CM | POA: Diagnosis not present

## 2023-07-03 DIAGNOSIS — L814 Other melanin hyperpigmentation: Secondary | ICD-10-CM | POA: Diagnosis not present

## 2023-07-12 DIAGNOSIS — Z79899 Other long term (current) drug therapy: Secondary | ICD-10-CM | POA: Diagnosis not present

## 2023-07-12 DIAGNOSIS — W22042A Striking against wall of swimming pool causing other injury, initial encounter: Secondary | ICD-10-CM | POA: Diagnosis not present

## 2023-07-12 DIAGNOSIS — M79671 Pain in right foot: Secondary | ICD-10-CM | POA: Diagnosis not present

## 2023-07-12 DIAGNOSIS — M7989 Other specified soft tissue disorders: Secondary | ICD-10-CM | POA: Diagnosis not present

## 2023-07-12 DIAGNOSIS — S92911A Unspecified fracture of right toe(s), initial encounter for closed fracture: Secondary | ICD-10-CM | POA: Diagnosis not present

## 2023-07-30 DIAGNOSIS — C44212 Basal cell carcinoma of skin of right ear and external auricular canal: Secondary | ICD-10-CM | POA: Diagnosis not present

## 2023-07-30 DIAGNOSIS — C44319 Basal cell carcinoma of skin of other parts of face: Secondary | ICD-10-CM | POA: Diagnosis not present

## 2023-08-27 DIAGNOSIS — Z483 Aftercare following surgery for neoplasm: Secondary | ICD-10-CM | POA: Diagnosis not present

## 2023-08-27 DIAGNOSIS — C44319 Basal cell carcinoma of skin of other parts of face: Secondary | ICD-10-CM | POA: Diagnosis not present

## 2023-08-27 DIAGNOSIS — C44212 Basal cell carcinoma of skin of right ear and external auricular canal: Secondary | ICD-10-CM | POA: Diagnosis not present

## 2023-08-27 DIAGNOSIS — C44399 Other specified malignant neoplasm of skin of other parts of face: Secondary | ICD-10-CM | POA: Diagnosis not present

## 2023-08-27 DIAGNOSIS — C44202 Unspecified malignant neoplasm of skin of right ear and external auricular canal: Secondary | ICD-10-CM | POA: Diagnosis not present

## 2023-09-03 DIAGNOSIS — Z483 Aftercare following surgery for neoplasm: Secondary | ICD-10-CM | POA: Diagnosis not present

## 2023-09-03 DIAGNOSIS — C44399 Other specified malignant neoplasm of skin of other parts of face: Secondary | ICD-10-CM | POA: Diagnosis not present

## 2023-09-03 DIAGNOSIS — C44202 Unspecified malignant neoplasm of skin of right ear and external auricular canal: Secondary | ICD-10-CM | POA: Diagnosis not present

## 2023-11-11 ENCOUNTER — Other Ambulatory Visit: Payer: Self-pay | Admitting: Physician Assistant

## 2023-11-25 ENCOUNTER — Other Ambulatory Visit: Payer: Self-pay | Admitting: Physician Assistant

## 2023-12-09 ENCOUNTER — Other Ambulatory Visit: Payer: Self-pay | Admitting: Physician Assistant

## 2023-12-15 DIAGNOSIS — H9391 Unspecified disorder of right ear: Secondary | ICD-10-CM | POA: Diagnosis not present

## 2023-12-15 DIAGNOSIS — M952 Other acquired deformity of head: Secondary | ICD-10-CM | POA: Diagnosis not present

## 2024-01-14 DIAGNOSIS — B9689 Other specified bacterial agents as the cause of diseases classified elsewhere: Secondary | ICD-10-CM | POA: Diagnosis not present

## 2024-01-14 DIAGNOSIS — J019 Acute sinusitis, unspecified: Secondary | ICD-10-CM | POA: Diagnosis not present
